# Patient Record
Sex: Male | Born: 1985 | Race: Black or African American | Hispanic: No | Marital: Single | Smoking: Never smoker
Health system: Southern US, Community
[De-identification: ages and names within clinical notes are randomized; demographics above are authoritative.]

## PROBLEM LIST (undated history)

## (undated) DIAGNOSIS — J45909 Unspecified asthma, uncomplicated: Secondary | ICD-10-CM

---
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
## 2003-06-12 ENCOUNTER — Emergency Department (HOSPITAL_COMMUNITY): Admission: AD | Admit: 2003-06-12 | Discharge: 2003-06-12 | Payer: Self-pay | Admitting: Family Medicine

## 2004-05-30 ENCOUNTER — Emergency Department (HOSPITAL_COMMUNITY): Admission: EM | Admit: 2004-05-30 | Discharge: 2004-05-30 | Payer: Self-pay | Admitting: Family Medicine

## 2005-04-06 ENCOUNTER — Emergency Department (HOSPITAL_COMMUNITY): Admission: EM | Admit: 2005-04-06 | Discharge: 2005-04-06 | Payer: Self-pay | Admitting: Emergency Medicine

## 2006-01-02 ENCOUNTER — Emergency Department (HOSPITAL_COMMUNITY): Admission: EM | Admit: 2006-01-02 | Discharge: 2006-01-02 | Payer: Self-pay | Admitting: Emergency Medicine

## 2006-05-07 ENCOUNTER — Emergency Department (HOSPITAL_COMMUNITY): Admission: EM | Admit: 2006-05-07 | Discharge: 2006-05-07 | Payer: Self-pay | Admitting: Emergency Medicine

## 2006-07-17 ENCOUNTER — Emergency Department (HOSPITAL_COMMUNITY): Admission: EM | Admit: 2006-07-17 | Discharge: 2006-07-17 | Payer: Self-pay | Admitting: Family Medicine

## 2007-08-28 IMAGING — CR DG CHEST 2V
2 series · 2 of 2 positions shown · non-contrast
Comparison: None.

CLINICAL DATA: Cough, fever, and chest pain.
 CHEST - 2 VIEW:

[view not recorded (1 of 2)]
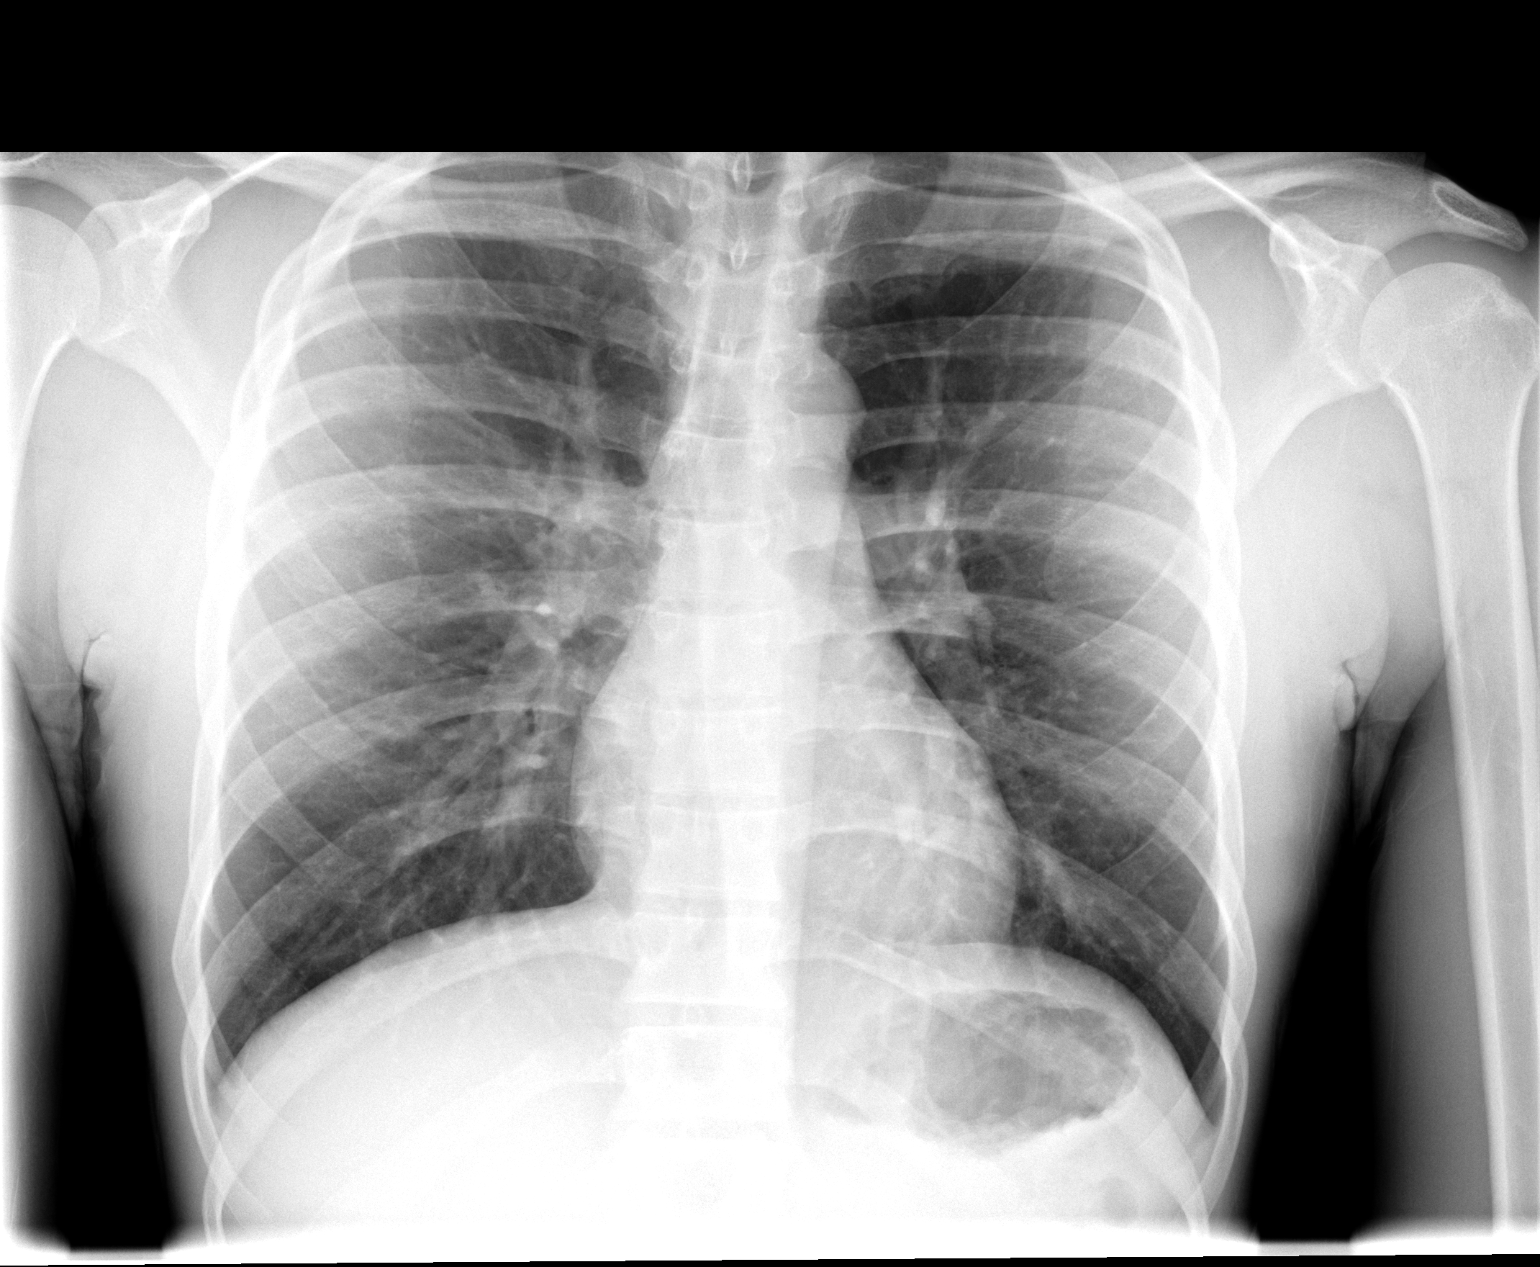

[view not recorded (2 of 2)]
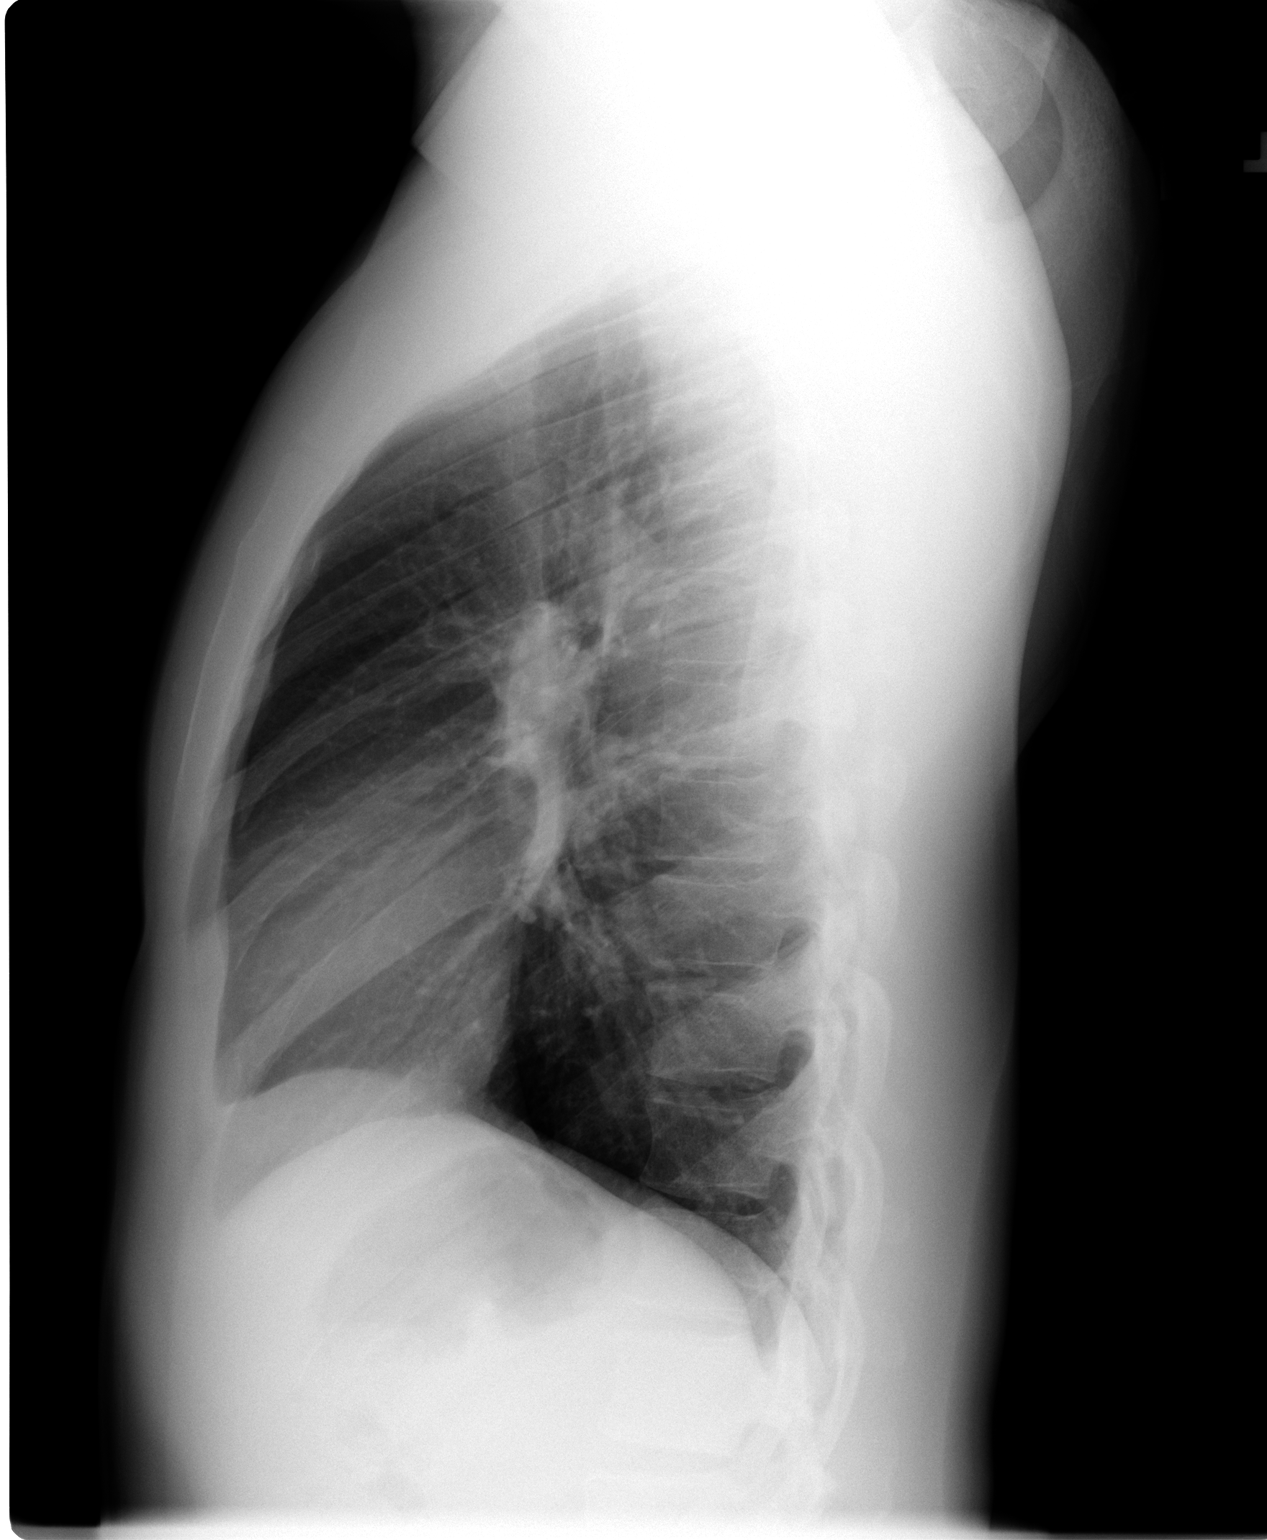

[2 of 2 positions shown; findings below may reference images not displayed]

FINDINGS: The heart size and mediastinal contours are within normal limits.  Both lungs are clear.  The visualized skeletal structures are unremarkable.
IMPRESSION: No active cardiopulmonary disease.

## 2007-10-09 ENCOUNTER — Emergency Department (HOSPITAL_COMMUNITY): Admission: EM | Admit: 2007-10-09 | Discharge: 2007-10-09 | Payer: Self-pay | Admitting: Family Medicine

## 2013-06-27 ENCOUNTER — Encounter (HOSPITAL_COMMUNITY): Payer: Self-pay | Admitting: Emergency Medicine

## 2013-06-27 ENCOUNTER — Emergency Department (HOSPITAL_COMMUNITY)
Admission: EM | Admit: 2013-06-27 | Discharge: 2013-06-27 | Disposition: A | Discharge status: Home / Self Care | Payer: Self-pay | Attending: Emergency Medicine | Admitting: Emergency Medicine

## 2013-06-27 ENCOUNTER — Emergency Department (HOSPITAL_COMMUNITY): Payer: Self-pay

## 2013-06-27 DIAGNOSIS — Z23 Encounter for immunization: Secondary | ICD-10-CM | POA: Insufficient documentation

## 2013-06-27 DIAGNOSIS — S0280XA Fracture of other specified skull and facial bones, unspecified side, initial encounter for closed fracture: Secondary | ICD-10-CM | POA: Insufficient documentation

## 2013-06-27 DIAGNOSIS — H538 Other visual disturbances: Secondary | ICD-10-CM | POA: Insufficient documentation

## 2013-06-27 DIAGNOSIS — S0180XA Unspecified open wound of other part of head, initial encounter: Secondary | ICD-10-CM | POA: Insufficient documentation

## 2013-06-27 DIAGNOSIS — J45909 Unspecified asthma, uncomplicated: Secondary | ICD-10-CM | POA: Insufficient documentation

## 2013-06-27 DIAGNOSIS — S0285XA Fracture of orbit, unspecified, initial encounter for closed fracture: Secondary | ICD-10-CM

## 2013-06-27 HISTORY — DX: Unspecified asthma, uncomplicated: J45.909

## 2013-06-27 LAB — CBC WITH DIFFERENTIAL/PLATELET
BASOS PCT: 0 % (ref 0–1)
Basophils Absolute: 0 10*3/uL (ref 0.0–0.1)
Eosinophils Absolute: 0 10*3/uL (ref 0.0–0.7)
Eosinophils Relative: 0 % (ref 0–5)
HEMATOCRIT: 46.2 % (ref 39.0–52.0)
HEMOGLOBIN: 15.7 g/dL (ref 13.0–17.0)
LYMPHS PCT: 10 % — AB (ref 12–46)
Lymphs Abs: 0.9 10*3/uL (ref 0.7–4.0)
MCH: 29.9 pg (ref 26.0–34.0)
MCHC: 34 g/dL (ref 30.0–36.0)
MCV: 88 fL (ref 78.0–100.0)
MONO ABS: 0.4 10*3/uL (ref 0.1–1.0)
MONOS PCT: 4 % (ref 3–12)
Neutro Abs: 7.5 10*3/uL (ref 1.7–7.7)
Neutrophils Relative %: 86 % — ABNORMAL HIGH (ref 43–77)
Platelets: 266 10*3/uL (ref 150–400)
RBC: 5.25 MIL/uL (ref 4.22–5.81)
RDW: 13.2 % (ref 11.5–15.5)
WBC: 8.7 10*3/uL (ref 4.0–10.5)

## 2013-06-27 LAB — POCT I-STAT, CHEM 8
BUN: 9 mg/dL (ref 6–23)
CALCIUM ION: 1.22 mmol/L (ref 1.12–1.23)
CHLORIDE: 103 meq/L (ref 96–112)
Creatinine, Ser: 1.3 mg/dL (ref 0.50–1.35)
GLUCOSE: 109 mg/dL — AB (ref 70–99)
HCT: 50 % (ref 39.0–52.0)
Hemoglobin: 17 g/dL (ref 13.0–17.0)
Potassium: 3.7 mEq/L (ref 3.7–5.3)
Sodium: 142 mEq/L (ref 137–147)
TCO2: 24 mmol/L (ref 0–100)

## 2013-06-27 MED ORDER — LIDOCAINE HCL 1 % IJ SOLN
5.0000 mL | Freq: Once | INTRAMUSCULAR | Status: AC
  Administered 2013-06-27: 5 mL

## 2013-06-27 MED ORDER — HYDROCODONE-ACETAMINOPHEN 5-325 MG PO TABS: 1.0000 | ORAL_TABLET | ORAL | Status: DC | PRN

## 2013-06-27 MED ORDER — TETANUS-DIPHTH-ACELL PERTUSSIS 5-2.5-18.5 LF-MCG/0.5 IM SUSP
0.5000 mL | Freq: Once | INTRAMUSCULAR | Status: AC
  Administered 2013-06-27: 0.5 mL via INTRAMUSCULAR
  Filled 2013-06-27: qty 0.5
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
## 2013-06-27 NOTE — ED Provider Notes (Signed)
Medical screening examination/treatment/procedure(s) were performed by non-physician practitioner and as supervising physician I was immediately available for consultation/collaboration.    Thoren Hosang, MD 06/27/13 0712 

## 2013-06-27 NOTE — ED Notes (Signed)
Pt has a laceration under right eyebrow, right eye is swollen w/ blood present, blood also present in left eye.  Pt c/o pain 5/10 to right eye.  Swelling present on left side of face most notably above lip.
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
## 2013-06-27 NOTE — ED Provider Notes (Signed)
Medical screening examination/treatment/procedure(s) were performed by non-physician practitioner and as supervising physician I was immediately available for consultation/collaboration.    Lindalou Soltis, MD 06/27/13 2259 
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
## 2013-06-27 NOTE — ED Notes (Addendum)
Pt's visual acuity is 20/25 in b/l eyes.
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
## 2018-01-28 ENCOUNTER — Institutional Professional Consult (permissible substitution): Payer: Self-pay | Admitting: Pulmonary Disease

## 2023-03-06 ENCOUNTER — Ambulatory Visit: Payer: PRIVATE HEALTH INSURANCE | Admitting: Nurse Practitioner

## 2023-03-06 ENCOUNTER — Encounter: Payer: Self-pay | Admitting: Nurse Practitioner

## 2023-03-06 VITALS — BP 130/74 | HR 55

## 2023-03-06 DIAGNOSIS — Z789 Other specified health status: Secondary | ICD-10-CM

## 2023-03-06 NOTE — Progress Notes (Signed)
Occupational [facility]- [facility]  Subjective:  Patient ID: Keith Casey, male    DOB: April 15, 1986  Age: 37 y.o. MRN: [id number]  CC: wellness exam   HPI Keith Casey presents for wellness exam visit for insurance benefit.  Patient has a PCP: Tracey Harries [facility] significant for: headaches, asthma, elevated BP readings. Reports that at one point he was having migraines but the medication he was started on made him feel "funny". Last labs per PCP were completed 2022  Health Maintenance:  Colonoscopy:No family hx  PSA; no family hx   Smoker:never  Immunizations:  Flu- does not get yearly. COVID- x2   Tdap- due 2025  Lifestyle: Diet- reports eating small meals but eats a variety of foods.  Exercise- works out twice a week and plays pickleball once a week.      Past Medical History:  Diagnosis Date   Asthma     Outpatient Medications Prior to Visit  Medication Sig Dispense Refill   Albuterol Sulfate (PROAIR RESPICLICK) 108 (90 Base) MCG/ACT AEPB Inhale into the lungs.     HYDROcodone-acetaminophen (NORCO/VICODIN) 5-325 MG per tablet Take 1 tablet by mouth every 4 (four) hours as needed. 10 tablet 0   No facility-administered medications prior to visit.    ROS Review of Systems  Constitutional:  Negative for fever.  HENT:  Negative for hearing loss.   Eyes:  Negative for visual disturbance.  Respiratory:  Negative for shortness of breath.   Cardiovascular:  Negative for chest pain.  Gastrointestinal:  Negative for constipation, diarrhea, nausea and vomiting.  Musculoskeletal:  Negative for back pain and myalgias.  Neurological:  Negative for dizziness and headaches.    Objective:  BP 130/74   Pulse (!) 55   SpO2 99%   Physical Exam   Assessment & Plan:    Keith Casey was seen today for wellness exam.  Diagnoses and all orders for this visit:  Participant in health and wellness plan Adult wellness physical was conducted today. Importance of diet and  exercise were discussed in detail.  We reviewed immunizations and gave recommendations regarding current immunization needed for age.  Preventative health exams are up to date.  Patient was advised yearly wellness exam. Follow-up with PCP as recommended.  Elevated BP readings today. Discussed BP goal less than 130/80. Encouraged DASH diet. Patient to obtain BP log at home and follow-up if readings remain above 130/80.    No orders of the defined types were placed in this encounter.   No orders of the defined types were placed in this encounter.   Follow-up: as needed

## 2023-03-27 ENCOUNTER — Ambulatory Visit: Payer: PRIVATE HEALTH INSURANCE | Admitting: Nurse Practitioner

## 2023-03-27 ENCOUNTER — Encounter: Payer: Self-pay | Admitting: Nurse Practitioner

## 2023-03-27 VITALS — BP 152/82 | HR 72 | Temp 97.6°F

## 2023-03-27 DIAGNOSIS — I10 Essential (primary) hypertension: Secondary | ICD-10-CM | POA: Insufficient documentation

## 2023-03-27 MED ORDER — AMLODIPINE BESYLATE 2.5 MG PO TABS: 2.5000 mg | ORAL_TABLET | Freq: Every day | ORAL | 1 refills | Status: DC

## 2023-03-27 NOTE — Progress Notes (Signed)
   Subjective:    Patient ID: Keith Casey, male    DOB: 1985/06/13, 37 y.o.   MRN: [id number]  HPI Keith Casey presents today for a follow up visit for his blood pressure. He has been checking his blood pressures at home and reports that they have been ranging in the high 130's for systolics. Reports that he does have a history of chronic migraines and high blood pressure. Took Propanolol in the past, but did not like the side effects. Reports that he does eat a lot of processed foods at home. Does drink 5-hour energy drinks occasionally.   Review of Systems  Constitutional:  Negative for chills and fever.  Eyes:  Negative for visual disturbance.  Cardiovascular:  Negative for chest pain and palpitations.  Neurological:  Positive for headaches. Negative for dizziness and light-headedness.      Objective:   Physical Exam Vitals reviewed.  Constitutional:      Appearance: Normal appearance. He is not ill-appearing.  Cardiovascular:     Rate and Rhythm: Normal rate and regular rhythm.  Pulmonary:     Effort: Pulmonary effort is normal.     Breath sounds: Normal breath sounds.  Skin:    General: Skin is warm and dry.  Neurological:     General: No focal deficit present.     Mental Status: He is alert and oriented to person, place, and time.  Psychiatric:        Mood and Affect: Mood normal.        Behavior: Behavior normal.        Thought Content: Thought content normal.        Judgment: Judgment normal.    Vitals:   03/27/23 1416 03/27/23 1420  BP: (!) 154/81 (!) 152/82  Pulse: 72   Temp: 97.6 F (36.4 C)   SpO2: 99%   TempSrc: Temporal         Assessment & Plan:  1. Primary hypertension - amLODipine (NORVASC) 2.5 MG tablet; Take 1 tablet (2.5 mg total) by mouth daily.  Dispense: 30 tablet; Refill: 1  -Educated patient about DASH diet and restricting sodium. Educated about high sodium foods to avoid, and how to read nutrition labels.  -Advised patient to monitor and  record blood pressures at home. -Advised to follow up with PCP to manage blood pressure.

## 2023-03-27 NOTE — Progress Notes (Deleted)
   Established Patient Office Visit  Subjective   Patient ID: Keith Casey, male    DOB: [birth year]  Age: 37 y.o. MRN: [id number]  No chief complaint on file.   HPI  {History (Optional):23778}  ROS    Objective:     BP (!) 152/82 (BP Location: Right Arm)   Pulse 72   Temp 97.6 F (36.4 C) (Temporal)   SpO2 99%  {Vitals History (Optional):23777}  Physical Exam   No results found for any visits on 03/27/23.  {Labs (Optional):23779}  The ASCVD Risk score (Arnett DK, et al., 2019) failed to calculate for the following reasons:   The 2019 ASCVD risk score is only valid for ages 39 to 16    Assessment & Plan:   Problem List Items Addressed This Visit       Cardiovascular and Mediastinum   Primary hypertension - Primary   Relevant Medications   amLODipine (NORVASC) 2.5 MG tablet    No follow-ups on file.    Gloris Ham, NP

## 2023-10-02 ENCOUNTER — Encounter: Payer: Self-pay | Admitting: Nurse Practitioner

## 2023-10-02 ENCOUNTER — Ambulatory Visit: Payer: PRIVATE HEALTH INSURANCE | Admitting: Nurse Practitioner

## 2023-10-02 VITALS — BP 134/94 | HR 59

## 2023-10-02 DIAGNOSIS — Z789 Other specified health status: Secondary | ICD-10-CM

## 2023-10-02 DIAGNOSIS — I10 Essential (primary) hypertension: Secondary | ICD-10-CM

## 2023-10-02 NOTE — Progress Notes (Signed)
 Occupational [facility]- [facility]  Subjective:  Patient ID: Keith Casey, male    DOB: 1985-12-09  Age: 38 y.o. MRN: [id number]  CC: Wellness exam   HPI Keith Casey presents for wellness exam visit for insurance benefit.  Patient has a PCP: Ponce Brisker [facility] significant for: HTN, admits to not taking amlodipine  due to side effects (states he felt dizzy). Asthma on albuterol PRN Last labs per PCP were completed: Nov 2025  Health Maintenance:  Colonoscopy: no family hx  Smoker:never  Immunizations:  Flu- declines Tdap- declines    Lifestyle: Diet- cut out fried foods.  Exercise- tries to stay active. Ride bikes, jogs.      Past Medical History:  Diagnosis Date   Asthma     Outpatient Medications Prior to Visit  Medication Sig Dispense Refill   Albuterol Sulfate (PROAIR RESPICLICK) 108 (90 Base) MCG/ACT AEPB Inhale into the lungs.     amLODipine  (NORVASC ) 5 MG tablet Take 5 mg by mouth daily. (Patient not taking: Reported on 10/02/2023)     amLODipine  (NORVASC ) 2.5 MG tablet Take 1 tablet (2.5 mg total) by mouth daily. (Patient not taking: Reported on 10/02/2023) 30 tablet 1   No facility-administered medications prior to visit.    ROS Review of Systems  HENT:  Negative for hearing loss.   Eyes:  Negative for visual disturbance.  Respiratory:  Positive for cough and chest tightness. Negative for shortness of breath.        Related to seasonal allergies  Cardiovascular:  Negative for chest pain.  Gastrointestinal:  Negative for constipation, diarrhea, nausea and vomiting. Blood in stool: chronic. Musculoskeletal:  Positive for back pain. Negative for arthralgias and myalgias.  Neurological:  Negative for dizziness and headaches.    Objective:  BP (!) 134/94 (BP Location: Right Arm, Patient Position: Sitting, Cuff Size: Normal)   Pulse (!) 59   SpO2 96%   Physical Exam Constitutional:      General: He is not in acute distress. HENT:     Head:  Normocephalic.     Right Ear: Tympanic membrane, ear canal and external ear normal.     Left Ear: Tympanic membrane, ear canal and external ear normal.     Mouth/Throat:     Mouth: Mucous membranes are moist.     Pharynx: No oropharyngeal exudate or posterior oropharyngeal erythema.  Eyes:     Pupils: Pupils are equal, round, and reactive to light.  Cardiovascular:     Rate and Rhythm: Regular rhythm. Bradycardia present.     Heart sounds: Normal heart sounds.  Pulmonary:     Effort: Pulmonary effort is normal.     Breath sounds: Normal breath sounds.  Musculoskeletal:        General: Normal range of motion.     Right lower leg: No edema.     Left lower leg: No edema.  Skin:    General: Skin is warm.  Neurological:     General: No focal deficit present.     Mental Status: He is alert and oriented to person, place, and time.  Psychiatric:        Mood and Affect: Mood normal.        Behavior: Behavior normal.      Assessment & Plan:    Kruze was seen today for wellness exam.  Diagnoses and all orders for this visit:  Participant in health and wellness plan Adult wellness physical was conducted today. Importance of diet and exercise were discussed  in detail.  We reviewed immunizations and gave recommendations regarding current immunization needed for age. Recommended Tdap, will follow-up if interested in this.  Preventative health exams are up to date  Patient was advised yearly wellness exam.   Primary hypertension Recommended restarting amlodipine  at half dose. Encouraging continue monitor BP readings with goal less then 130/80. If unable to continue amlodipine , instructed to follow-up with PCP for further management.      No orders of the defined types were placed in this encounter.   No orders of the defined types were placed in this encounter.   Follow-up: as needed
